# Patient Record
Sex: Female | Born: 1995 | Race: White | Hispanic: No | Marital: Single | Smoking: Never smoker
Health system: Southern US, Community
[De-identification: ages and names within clinical notes are randomized; demographics above are authoritative.]

---

## 2017-05-09 ENCOUNTER — Encounter (HOSPITAL_COMMUNITY): Payer: Self-pay

## 2017-05-09 ENCOUNTER — Emergency Department (HOSPITAL_COMMUNITY)
Admission: EM | Admit: 2017-05-09 | Discharge: 2017-05-09 | Disposition: A | Discharge status: Home / Self Care | Payer: Medicaid Other | Attending: Emergency Medicine | Admitting: Emergency Medicine

## 2017-05-09 ENCOUNTER — Other Ambulatory Visit: Payer: Self-pay

## 2017-05-09 ENCOUNTER — Emergency Department (HOSPITAL_COMMUNITY): Payer: Medicaid Other

## 2017-05-09 DIAGNOSIS — R112 Nausea with vomiting, unspecified: Secondary | ICD-10-CM | POA: Diagnosis not present

## 2017-05-09 DIAGNOSIS — R1010 Upper abdominal pain, unspecified: Secondary | ICD-10-CM

## 2017-05-09 DIAGNOSIS — K5900 Constipation, unspecified: Secondary | ICD-10-CM | POA: Insufficient documentation

## 2017-05-09 LAB — COMPREHENSIVE METABOLIC PANEL
ALT: 14 U/L (ref 14–54)
AST: 16 U/L (ref 15–41)
Albumin: 3.8 g/dL (ref 3.5–5.0)
Alkaline Phosphatase: 73 U/L (ref 38–126)
Anion gap: 9 (ref 5–15)
BUN: 10 mg/dL (ref 6–20)
CHLORIDE: 104 mmol/L (ref 101–111)
CO2: 26 mmol/L (ref 22–32)
CREATININE: 0.7 mg/dL (ref 0.44–1.00)
Calcium: 9.1 mg/dL (ref 8.9–10.3)
GFR calc Af Amer: >60 mL/min (ref >60)
GFR calc non Af Amer: >60 mL/min (ref >60)
Glucose, Bld: 112 mg/dL — ABNORMAL HIGH (ref 65–99)
POTASSIUM: 3.8 mmol/L (ref 3.5–5.1)
SODIUM: 139 mmol/L (ref 135–145)
Total Bilirubin: 0.6 mg/dL (ref 0.3–1.2)
Total Protein: 6.9 g/dL (ref 6.5–8.1)

## 2017-05-09 LAB — URINALYSIS, ROUTINE W REFLEX MICROSCOPIC
Bilirubin Urine: NEGATIVE
GLUCOSE, UA: NEGATIVE mg/dL
HGB URINE DIPSTICK: NEGATIVE
Ketones, ur: 20 mg/dL — AB
Leukocytes, UA: NEGATIVE
Nitrite: NEGATIVE
PH: 5 (ref 5.0–8.0)
PROTEIN: NEGATIVE mg/dL
SPECIFIC GRAVITY, URINE: 1.021 (ref 1.005–1.030)

## 2017-05-09 LAB — CBC
HEMATOCRIT: 41 % (ref 36.0–46.0)
Hemoglobin: 13.5 g/dL (ref 12.0–15.0)
MCH: 27.4 pg (ref 26.0–34.0)
MCHC: 32.9 g/dL (ref 30.0–36.0)
MCV: 83.3 fL (ref 78.0–100.0)
PLATELETS: 287 10*3/uL (ref 150–400)
RBC: 4.92 MIL/uL (ref 3.87–5.11)
RDW: 14.4 % (ref 11.5–15.5)
WBC: 10.5 10*3/uL (ref 4.0–10.5)

## 2017-05-09 LAB — I-STAT BETA HCG BLOOD, ED (MC, WL, AP ONLY): I-stat hCG, quantitative: <5 m[IU]/mL (ref <5)

## 2017-05-09 LAB — LIPASE, BLOOD: LIPASE: 23 U/L (ref 11–51)

## 2017-05-09 MED ORDER — ONDANSETRON HCL 4 MG PO TABS: 4.0000 mg | ORAL_TABLET | Freq: Four times a day (QID) | ORAL | 0 refills | Status: DC

## 2017-05-09 MED ORDER — ONDANSETRON 4 MG PO TBDP
4.0000 mg | ORAL_TABLET | Freq: Once | ORAL | Status: AC
  Administered 2017-05-09: 4 mg via ORAL
  Filled 2017-05-09: qty 1

## 2017-05-09 MED ORDER — ONDANSETRON 4 MG PO TBDP: 4.0000 mg | ORAL_TABLET | Freq: Three times a day (TID) | ORAL | 0 refills | Status: AC | PRN

## 2017-05-09 NOTE — ED Triage Notes (Signed)
States abdominal pain for 3 days with nausea and vomiting and constipation.

## 2017-05-09 NOTE — ED Provider Notes (Signed)
[facility]-EMERGENCY DEPT Provider Note   CSN: [id number] Arrival date & time: 05/09/17  0414     History   Chief Complaint Chief Complaint  Patient presents with  . Abdominal Pain    HPI Leslie Shelton is a 22 y.o. female resents to the emergency department which is a chief complaint of abdominal pain with associated N/V and constipation.   Patient endorses intermittent bilateral upper abdominal pain that began 3 days ago.  The pain initially began as cramping and burning pain, but she now characterizes it as "sharp".  When the pain comes and goes, it will last for a few minutes before resolving spontaneously.  Pain is worse with movement, including walking and laying flat.  No association with eating.  She states that she was told several years ago that she may have 'gallbladder problems". Diet has consisted of pasta and sandwiches. No alcohol use. NSAID use is minimal.   She also endorses constant nausea with 3 episodes of NBNB that began yesterday, last episode just PTA.  She states that she has had several small, hard BMs over the last few weeks, but is unable to recall the last time she had a soft, normal stool.  Last hard stool was at midnight last night.  She denies fever, chills, diarrhea, dysuria, hematuria, frequency, melena, hematochezia, hematemesis, flank pain, vaginal pain or discharge, back pain, chest pain, or dyspnea. LMP on 2/23. No h/o of abdominal surgery.   The history is provided by the patient. No language interpreter was used.  Abdominal Pain   This is a new problem. The current episode started more than 2 days ago. The problem occurs daily. The problem has not changed since onset.The pain is associated with an unknown factor. The pain is located in the RUQ and LUQ. The quality of the pain is cramping and burning. The pain is at a severity of 8/10. The pain is moderate. Associated symptoms include nausea, vomiting and constipation.  Pertinent negatives include fever, diarrhea, dysuria, frequency, hematuria, headaches and myalgias. The symptoms are aggravated by certain positions. Nothing relieves the symptoms.    History reviewed. No pertinent past medical history.  There are no active problems to display for this patient.   History reviewed. No pertinent surgical history.  OB History    No data available       Home Medications    Prior to Admission medications   Medication Sig Start Date End Date Taking? Authorizing Provider  acetaminophen (TYLENOL) 325 MG tablet Take 975 mg by mouth every 6 (six) hours as needed for moderate pain.   Yes [provider]  ibuprofen (ADVIL,MOTRIN) 200 MG tablet Take 400 mg by mouth every 6 (six) hours as needed for moderate pain.   Yes [provider]  levonorgestrel-ethinyl estradiol (LUTERA) 0.1-20 MG-MCG tablet Take 1 tablet by mouth daily.   Yes [provider]  ondansetron (ZOFRAN ODT) 4 MG disintegrating tablet Take 1 tablet (4 mg total) by mouth every 8 (eight) hours as needed for nausea or vomiting. 05/09/17   Stepen Prins A, PA-C    Family History History reviewed. No pertinent family history.  Social History Social History   Tobacco Use  . Smoking status: Never Smoker  . Smokeless tobacco: Never Used  Substance Use Topics  . Alcohol use: No    Frequency: Never  . Drug use: No     Allergies   Patient has no known allergies.   Review of Systems Review of  Systems  Constitutional: Negative for activity change, chills and fever.  HENT: Negative for congestion.   Eyes: Negative for visual disturbance.  Respiratory: Negative for shortness of breath.   Cardiovascular: Negative for chest pain.  Gastrointestinal: Positive for abdominal pain, constipation, nausea and vomiting. Negative for anal bleeding, blood in stool, diarrhea and rectal pain.  Genitourinary: Negative for dysuria, flank pain, frequency, hematuria, vaginal bleeding,  vaginal discharge and vaginal pain.  Musculoskeletal: Negative for back pain and myalgias.  Skin: Negative for rash.  Allergic/Immunologic: Negative for immunocompromised state.  Neurological: Negative for dizziness, weakness, numbness and headaches.  Psychiatric/Behavioral: Negative for confusion.     Physical Exam Updated Vital Signs BP 127/79 (BP Location: Left Arm)   Pulse (!) 56   Temp 98.4 F (36.9 C) (Oral)   Resp 16   Ht 5\' 3"  (1.6 m)   Wt (!) 142.9 kg (315 lb)   LMP 04/21/2017 Comment: neg preg test 05/09/17  SpO2 100%   BMI 55.80 kg/m   Physical Exam  Constitutional: No distress.  Morbidly obese.  HENT:  Head: Normocephalic.  Eyes: Conjunctivae are normal.  Neck: Neck supple.  Cardiovascular: Normal rate, regular rhythm, normal heart sounds and intact distal pulses. Exam reveals no gallop and no friction rub.  No murmur heard. Pulmonary/Chest: Effort normal. No stridor. No respiratory distress. She has no wheezes. She has no rales. She exhibits no tenderness.  Abdominal: Soft. Bowel sounds are normal. She exhibits no distension, no fluid wave, no ascites and no mass. There is tenderness. There is no rebound and no guarding. No hernia.  Tender to palpation over the epigastric and left upper quadrant.  Minimally tender to palpation in the left lower quadrant.  Exam is limited by body habitus.  Right-sided abdominal exam is unremarkable.  Negative Murphy sign.  No tenderness over McBurney's point.  No rebound or guarding.  No peritoneal signs.  No CVA tenderness bilaterally.  No overlying warmth, erythema, or edema to the abdominal wall.  Musculoskeletal: She exhibits no edema, tenderness or deformity.  Neurological: She is alert.  Skin: Skin is warm. Capillary refill takes less than 2 seconds. No rash noted. She is not diaphoretic.  Psychiatric: Her behavior is normal.  Nursing note and vitals reviewed.    ED Treatments / Results  Labs (all labs ordered are  listed, but only abnormal results are displayed) Labs Reviewed  COMPREHENSIVE METABOLIC PANEL - Abnormal; Notable for the following components:      Result Value   Glucose, Bld 112 (*)    All other components within normal limits  URINALYSIS, ROUTINE W REFLEX MICROSCOPIC - Abnormal; Notable for the following components:   APPearance HAZY (*)    Ketones, ur 20 (*)    All other components within normal limits  LIPASE, BLOOD  CBC  I-STAT BETA HCG BLOOD, ED ([facility], [facility], [facility] ONLY)    EKG  EKG Interpretation None       Radiology Dg Abdomen Acute W/chest  Result Date: 05/09/2017 CLINICAL DATA:  Constipation, vomiting EXAM: DG ABDOMEN ACUTE W/ 1V CHEST COMPARISON:  Chest x-ray 12/10/2015 FINDINGS: The bowel gas pattern is normal. There is no evidence of free intraperitoneal air. No suspicious radio-opaque calculi or other significant radiographic abnormality is seen. Heart size and mediastinal contours are within normal limits. Both lungs are clear. 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 No acute bony abnormality. IMPRESSION: No evidence of bowel obstruction or free air. No acute cardiopulmonary disease. Electronically  Signed   By: Charlett NoseKevin  Dover M.D.   On: 05/09/2017 07:55    Procedures Procedures (including critical care time)  Medications Ordered in ED Medications  ondansetron (ZOFRAN-ODT) disintegrating tablet 4 mg (4 mg Oral Given 05/09/17 0731)     Initial Impression / Assessment and Plan / ED Course  I have reviewed the triage vital signs and the nursing notes.  Pertinent labs & imaging results that were available during my care of the patient were reviewed by me and considered in my medical decision making (see chart for details).     Patient is nontoxic, nonseptic appearing, in no apparent distress.  Patient's pain and other symptoms adequately managed in emergency department. Labs, imaging and vitals reviewed.  Patient does not  meet the SIRS or Sepsis criteria.  On repeat exam patient does not have a surgical abdomen and there are no peritoneal signs.  No indication of appendicitis, bowel obstruction, bowel perforation, cholecystitis, diverticulitis, PID or ectopic pregnancy.  She is not clinically dehydrated.  Nausea resolved with Zofran and and patient is successfully tolerating fluids by mouth in the ED.  Patient discharged home with symptomatic treatment for constipation and given strict instructions for follow-up with their primary care physician.  I have also discussed reasons to return immediately to the ER.  Patient expresses understanding and agrees with plan.  Final Clinical Impressions(s) / ED Diagnoses   Final diagnoses:  Pain of upper abdomen  Constipation, unspecified constipation type  Non-intractable vomiting with nausea, unspecified vomiting type    ED Discharge Orders        Ordered    ondansetron (ZOFRAN) 4 MG tablet  Every 6 hours,   Status:  Discontinued     05/09/17 0848    ondansetron (ZOFRAN ODT) 4 MG disintegrating tablet  Every 8 hours PRN     05/09/17 0850       Barkley BoardsMcDonald, Orbie Grupe A, PA-C 05/09/17 0906    Lorre NickAllen, Anthony, MD 05/10/17 [phone]

## 2017-05-09 NOTE — Discharge Instructions (Signed)
Let 1 tablet of Zofran dissolve under the tongue every 8 hours as needed for nausea and vomiting.   For constipation, you can feel a glass half full of grape juice and half full of prune juice and warming up in the microwave for 30 seconds.  If no improvement, take 2 capfuls of Miralax by mouth mixed with 16 ounces of juice or gatorade once. -Then, take 1 capful of Miralax by mouth daily mixed with 16 ounces of juice or Gatorade. Continue this dose until you have a soft bowel movement. -If you experience diarrhea, you may decreased the daily dose by 1/2 capful as needed.  It is also important to drink at least 6-8 glasses of 8 ounces of water daily.  Maintaining a high-fiber diet is also important for preventing constipation.  If you are looking for ways to add fiber into your diet, Metamucil is available over-the-counter.  If your symptoms do not improve within the next 3-4 days, please schedule follow-up appointment with your primary care provider.  Abdominal (belly) pain can be caused by many things. Your caregiver performed an examination and possibly ordered blood/urine tests and imaging (CT scan, x-rays, ultrasound). Many cases can be observed and treated at home after initial evaluation in the emergency department. Even though you are being discharged home, abdominal pain can be unpredictable. Therefore, you need a repeated exam if your pain does not resolve, returns, or worsens. Most patients with abdominal pain don't have to be admitted to the hospital or have surgery, but serious problems like appendicitis and gallbladder attacks can start out as nonspecific pain. Many abdominal conditions cannot be diagnosed in one visit, so follow-up evaluations are very important. SEEK IMMEDIATE MEDICAL ATTENTION IF: The pain does not go away or becomes severe.  A temperature above 101 develops.  Repeated vomiting occurs (multiple episodes).  The pain becomes localized to portions of the abdomen. The  right side could possibly be appendicitis. In an adult, the left lower portion of the abdomen could be colitis or diverticulitis.  Blood is being passed in stools or vomit (bright red or black tarry stools).  Return also if you develop chest pain, difficulty breathing, dizziness or fainting, or become confused, poorly responsive, or inconsolable (young children).

## 2017-05-09 NOTE — ED Notes (Signed)
Fluid given to patient. Tolerated  So far.

## 2019-09-13 IMAGING — CR DG ABDOMEN ACUTE W/ 1V CHEST
4 series · 4 of 4 positions shown · non-contrast
Comparison: Chest x-ray 12/10/2015

CLINICAL DATA: Constipation, vomiting

EXAM:
DG ABDOMEN ACUTE W/ 1V CHEST

[w chest pa]
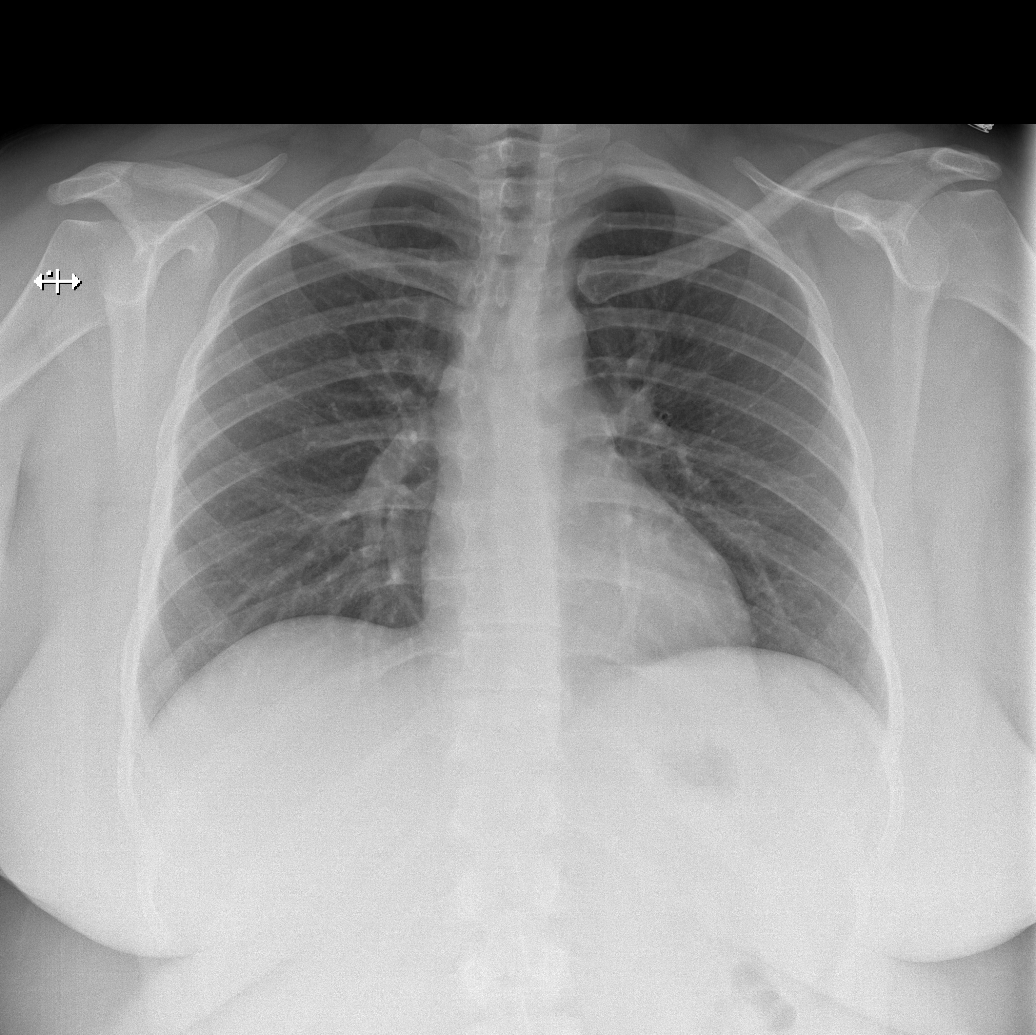

[w abdomen upright]
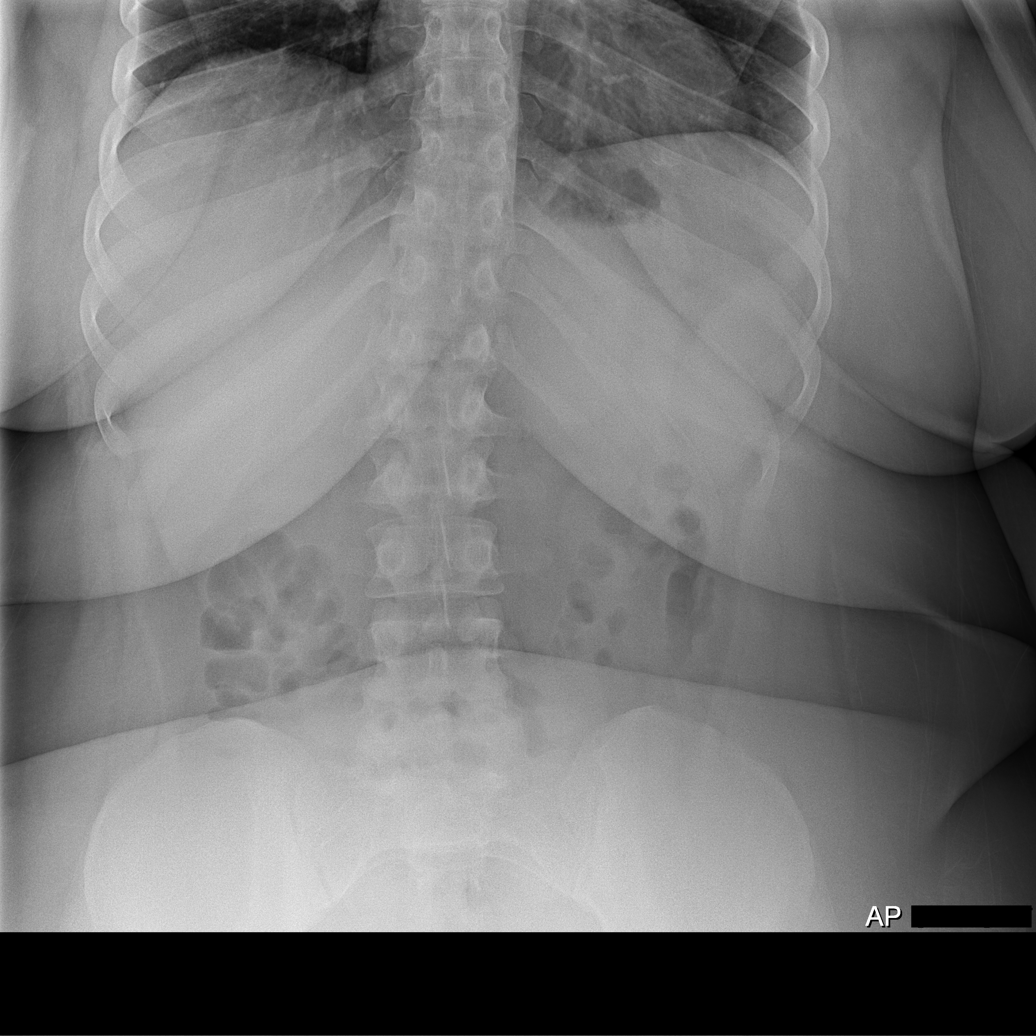

[t abdomen supine (1 of 2)]
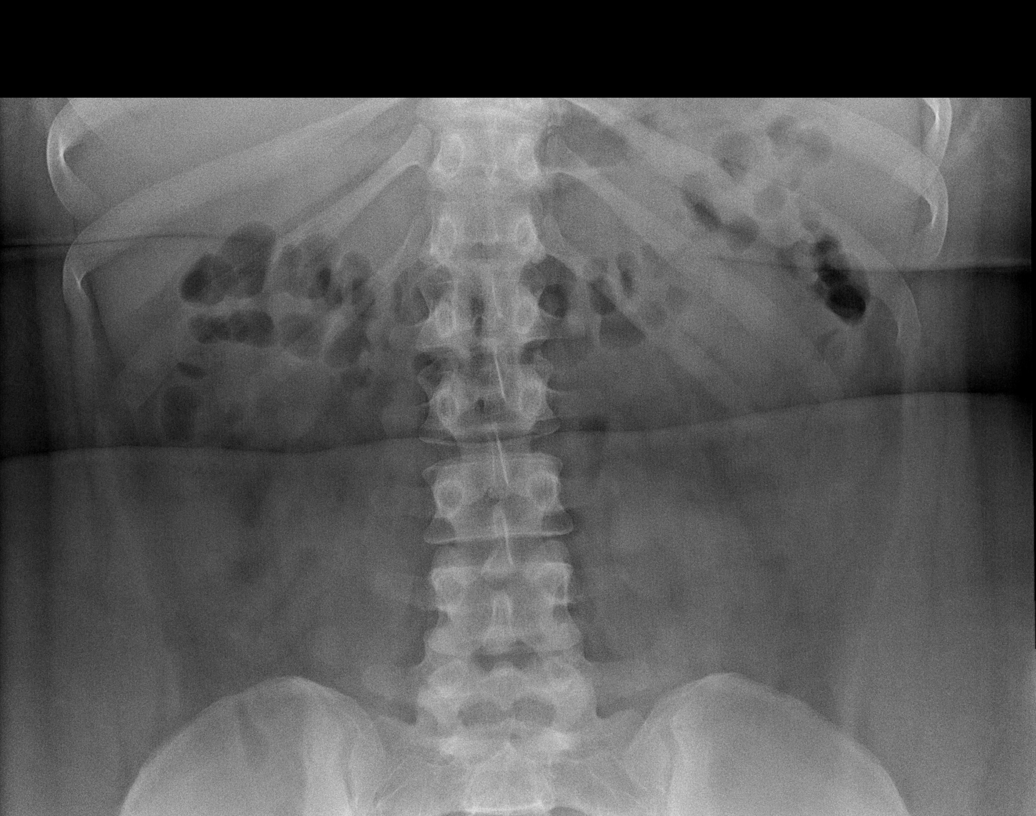

[t abdomen supine (2 of 2)]
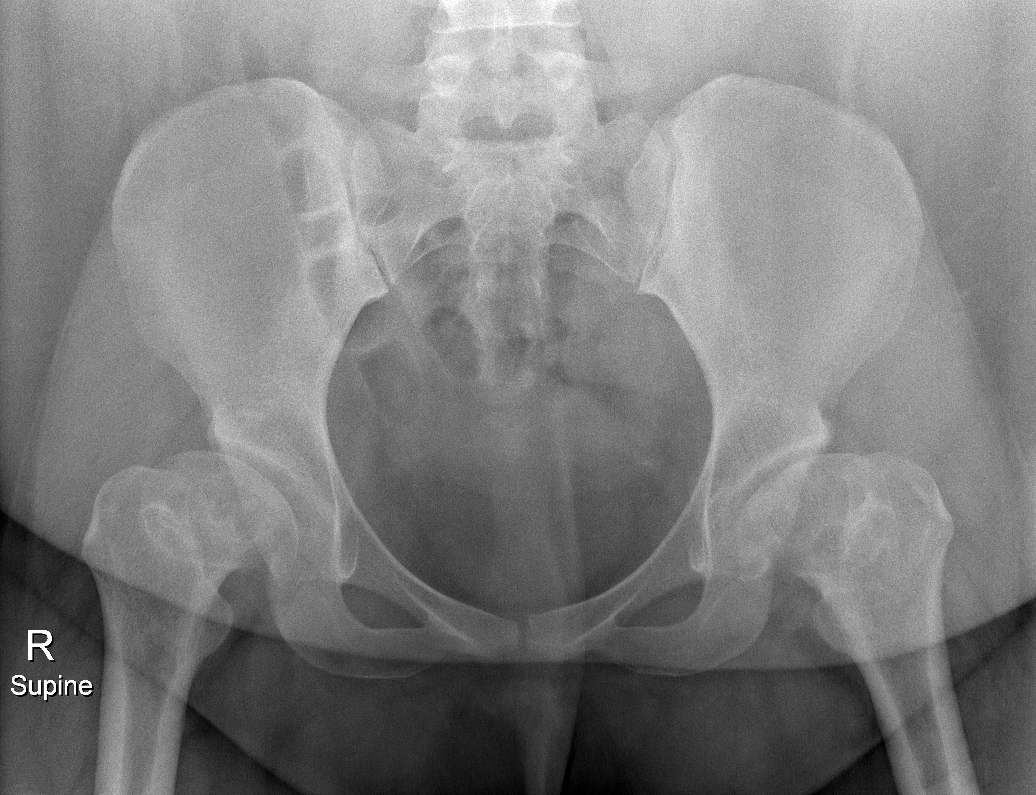

[4 of 4 positions shown; findings below may reference images not displayed]

FINDINGS: The bowel gas pattern is normal. There is no evidence of free
intraperitoneal air. No suspicious radio-opaque calculi or other
significant radiographic abnormality is seen. Heart size and
mediastinal contours are within normal limits. Both lungs are clear.

Flattening and uncovering of the femoral heads compatible with
bilateral hip dysplasia. No acute bony abnormality.
IMPRESSION: No evidence of bowel obstruction or free air.

No acute cardiopulmonary disease.

## 2022-09-11 ENCOUNTER — Other Ambulatory Visit: Payer: Self-pay | Admitting: Oncology

## 2022-09-11 DIAGNOSIS — Z006 Encounter for examination for normal comparison and control in clinical research program: Secondary | ICD-10-CM

## 2023-11-02 LAB — PANORAMA PRENATAL TEST FULL PANEL:PANORAMA TEST PLUS 5 ADDITIONAL MICRODELETIONS: FETAL FRACTION: 8

## 2023-12-10 ENCOUNTER — Other Ambulatory Visit: Payer: Self-pay | Admitting: Medical Genetics

## 2023-12-10 DIAGNOSIS — Z006 Encounter for examination for normal comparison and control in clinical research program: Secondary | ICD-10-CM
